# Patient Record
Sex: Male | Born: 1955
Health system: Southern US, Community
[De-identification: ages and names within clinical notes are randomized; demographics above are authoritative.]

## PROBLEM LIST (undated history)

## (undated) DIAGNOSIS — S82891A Other fracture of right lower leg, initial encounter for closed fracture: Secondary | ICD-10-CM

---

## 2007-05-02 ENCOUNTER — Ambulatory Visit: Payer: Self-pay | Admitting: Podiatry

## 2013-07-05 ENCOUNTER — Emergency Department: Payer: Self-pay | Admitting: Emergency Medicine

## 2013-07-06 LAB — CBC
HGB: 14.5 g/dL (ref 13.0–18.0)
MCH: 29.8 pg (ref 26.0–34.0)
MCHC: 34.6 g/dL (ref 32.0–36.0)
MCV: 86 fL (ref 80–100)
Platelet: 224 10*3/uL (ref 150–440)
RBC: 4.88 10*6/uL (ref 4.40–5.90)
WBC: 7.9 10*3/uL (ref 3.8–10.6)

## 2013-07-06 LAB — URINALYSIS, COMPLETE
Bacteria: NONE SEEN
Bilirubin,UR: NEGATIVE
Blood: NEGATIVE
Glucose,UR: NEGATIVE mg/dL (ref 0–75)
Leukocyte Esterase: NEGATIVE
Protein: NEGATIVE
RBC,UR: 1 /HPF (ref 0–5)
Squamous Epithelial: NONE SEEN

## 2013-07-06 LAB — COMPREHENSIVE METABOLIC PANEL
Anion Gap: 3 — ABNORMAL LOW (ref 7–16)
BUN: 12 mg/dL (ref 7–18)
Bilirubin,Total: 0.8 mg/dL (ref 0.2–1.0)
Chloride: 105 mmol/L (ref 98–107)
Co2: 26 mmol/L (ref 21–32)
EGFR (Non-African Amer.): >60
Glucose: 106 mg/dL — ABNORMAL HIGH (ref 65–99)
Osmolality: 268 (ref 275–301)
Potassium: 3.5 mmol/L (ref 3.5–5.1)

## 2015-10-08 ENCOUNTER — Encounter: Payer: Self-pay | Admitting: Emergency Medicine

## 2015-10-08 ENCOUNTER — Emergency Department
Admission: EM | Admit: 2015-10-08 | Discharge: 2015-10-08 | Disposition: A | Discharge status: Home / Self Care | Payer: BLUE CROSS/BLUE SHIELD | Attending: Emergency Medicine | Admitting: Emergency Medicine

## 2015-10-08 ENCOUNTER — Emergency Department: Payer: BLUE CROSS/BLUE SHIELD

## 2015-10-08 DIAGNOSIS — M25461 Effusion, right knee: Secondary | ICD-10-CM | POA: Diagnosis not present

## 2015-10-08 DIAGNOSIS — Y9289 Other specified places as the place of occurrence of the external cause: Secondary | ICD-10-CM | POA: Insufficient documentation

## 2015-10-08 DIAGNOSIS — Y9389 Activity, other specified: Secondary | ICD-10-CM | POA: Insufficient documentation

## 2015-10-08 DIAGNOSIS — S8991XA Unspecified injury of right lower leg, initial encounter: Secondary | ICD-10-CM | POA: Diagnosis present

## 2015-10-08 DIAGNOSIS — Y998 Other external cause status: Secondary | ICD-10-CM | POA: Diagnosis not present

## 2015-10-08 DIAGNOSIS — W1839XA Other fall on same level, initial encounter: Secondary | ICD-10-CM | POA: Insufficient documentation

## 2015-10-08 MED ORDER — NAPROXEN 500 MG PO TABS
500.0000 mg | ORAL_TABLET | Freq: Once | ORAL | Status: AC
  Administered 2015-10-08: 500 mg via ORAL
  Filled 2015-10-08: qty 1

## 2015-10-08 MED ORDER — TRAMADOL HCL 50 MG PO TABS: 50.0000 mg | ORAL_TABLET | Freq: Four times a day (QID) | ORAL | Status: AC | PRN

## 2015-10-08 MED ORDER — TRAMADOL HCL 50 MG PO TABS
50.0000 mg | ORAL_TABLET | Freq: Once | ORAL | Status: DC
  Filled 2015-10-08: qty 1

## 2015-10-08 MED ORDER — NAPROXEN 500 MG PO TABS: 500.0000 mg | ORAL_TABLET | Freq: Two times a day (BID) | ORAL | Status: AC

## 2015-10-08 NOTE — Discharge Instructions (Signed)
Wear elastic knee support for 2-3 days as needed. Knee Effusion Knee effusion means that you have extra fluid in your knee. This can cause pain. Your knee may be more difficult to bend and move. HOME CARE  Use crutches as told by your doctor.  Wear a knee brace as told by your doctor.  Keep your knee raised (elevated) when you are sitting or lying down.  Take medicines only as told by your doctor.  Do any rehabilitation or strengthening exercises as told by your doctor.  Rest your knee as told by your doctor. You may start doing your normal activities again when your doctor says it is okay.  Keep all follow-up visits as told by your doctor. This is important. GET HELP IF:   You continue to have pain in your knee. GET HELP RIGHT AWAY IF:  You have increased swelling or redness of your knee.  You have severe pain in your knee.  You have a fever.   This information is not intended to replace advice given to you by your health care provider. Make sure you discuss any questions you have with your health care provider.   Document Released: 08/18/2010 Document Revised: 08/06/2014 Document Reviewed: 03/01/2014 Elsevier Interactive Patient Education [employer].

## 2015-10-08 NOTE — ED Provider Notes (Signed)
[facility] Emergency Department Provider Note  ____________________________________________  Time seen: Approximately 8:08 PM  I have reviewed the triage vital signs and the nursing notes.   HISTORY  Chief Complaint Knee Pain    HPI Trevor Petty is a 60 y.o. male patient complaining of left knee pain secondary to a fall this afternoon.Patient state there is some mild swelling and pain along the medial aspect of the knee. Patient states pain increase ambulation. Patient has applied ice to the area since this injury. No other palliative measures for this complaint. Patient rates the pain as 7/10. Patient described a pain as "sharp".   History reviewed. No pertinent past medical history.  There are no active problems to display for this patient.   History reviewed. No pertinent past surgical history.  Current Outpatient Rx  Name  Route  Sig  Dispense  Refill  . naproxen (NAPROSYN) 500 MG tablet   Oral   Take 1 tablet (500 mg total) by mouth 2 (two) times daily with a meal.   20 tablet   0   . traMADol (ULTRAM) 50 MG tablet   Oral   Take 1 tablet (50 mg total) by mouth every 6 (six) hours as needed for moderate pain.   12 tablet   0     Allergies Review of patient's allergies indicates no known allergies.  No family history on file.  Social History Social History  Substance Use Topics  . Smoking status: Never Smoker   . Smokeless tobacco: None  . Alcohol Use: No    Review of Systems Constitutional: No fever/chills Eyes: No visual changes. ENT: No sore throat. Cardiovascular: Denies chest pain. Respiratory: Denies shortness of breath. Gastrointestinal: No abdominal pain.  No nausea, no vomiting.  No diarrhea.  No constipation. Genitourinary: Negative for dysuria. Musculoskeletal: Negative for back pain. Skin: Negative for rash. Neurological: Negative for headaches, focal weakness or numbness.  10-point ROS otherwise  negative.  ____________________________________________   PHYSICAL EXAM:  VITAL SIGNS: ED Triage Vitals  Enc Vitals Group     BP 10/08/15 1825 124/82 mmHg     Pulse Rate 10/08/15 1825 58     Resp 10/08/15 1825 20     Temp 10/08/15 1825 97.6 F (36.4 C)     Temp Source 10/08/15 1825 Oral     SpO2 10/08/15 1825 100 %     Weight 10/08/15 1825 165 lb (74.844 kg)     Height 10/08/15 1825 5\' 9"  (1.753 m)     Head Cir --      Peak Flow --      Pain Score 10/08/15 1827 7     Pain Loc --      Pain Edu? --      Excl. in [place]? --     Constitutional: Alert and oriented. Well appearing and in no acute distress. Eyes: Conjunctivae are normal. PERRL. EOMI. Head: Atraumatic. Nose: No congestion/rhinnorhea. Mouth/Throat: Mucous membranes are moist.  Oropharynx non-erythematous. Neck: No stridor.  No cervical spine tenderness to palpation. Hematological/Lymphatic/Immunilogical: No cervical lymphadenopathy. Cardiovascular: Normal rate, regular rhythm. Grossly normal heart sounds.  Good peripheral circulation. Respiratory: Normal respiratory effort.  No retractions. Lungs CTAB. Gastrointestinal: Soft and nontender. No distention. No abdominal bruits. No CVA tenderness. Musculoskeletal: No lower extremity tenderness nor edema.  No joint effusions. Neurologic:  Normal speech and language. No gross focal neurologic deficits are appreciated. No gait instability. Skin:  Skin is warm, dry and intact. No rash noted. Psychiatric: Mood  and affect are normal. Speech and behavior are normal.  ____________________________________________   LABS (all labs ordered are listed, but only abnormal results are displayed)  Labs Reviewed - No data to display ____________________________________________  EKG   ____________________________________________  RADIOLOGY  Mild effusion noticed on the left knee x-ray. I, Sable Feil, personally viewed and evaluated these images (plain radiographs) as part  of my medical decision making, as well as reviewing the written report by the radiologist.  ____________________________________________   PROCEDURES  Procedure(s) performed: None  Critical Care performed: No  ____________________________________________   INITIAL IMPRESSION / ASSESSMENT AND PLAN / ED COURSE  Pertinent labs & imaging results that were available during my care of the patient were reviewed by me and considered in my medical decision making (see chart for details).  Knee effusion secondary to contusion. She given discharge Instructions. Patient given a prescription for naproxen and tramadol. Patient advised to follow orthopedics it is no improvement 3-5 days. ____________________________________________   FINAL CLINICAL IMPRESSION(S) / ED DIAGNOSES  Final diagnoses:  Knee joint effusion, right      Sable Feil, PA-C 10/08/15 2025  Earleen Newport, MD 10/08/15 [id number]

## 2015-10-08 NOTE — ED Notes (Signed)
Patient transported to X-ray 

## 2015-10-08 NOTE — ED Notes (Signed)
Fell this afternoon, landing on L knee. Painful since

## 2017-08-31 IMAGING — CR DG KNEE COMPLETE 4+V*L*
1 series · 4 of 4 positions shown · non-contrast
Comparison: None.

CLINICAL DATA: Left knee pain after fall

EXAM:
LEFT KNEE - COMPLETE 4+ VIEW

[Series 1: dg knee complete 4 views left · 0.14mm/px · 4 of 4 slices shown]
[im 1/4]
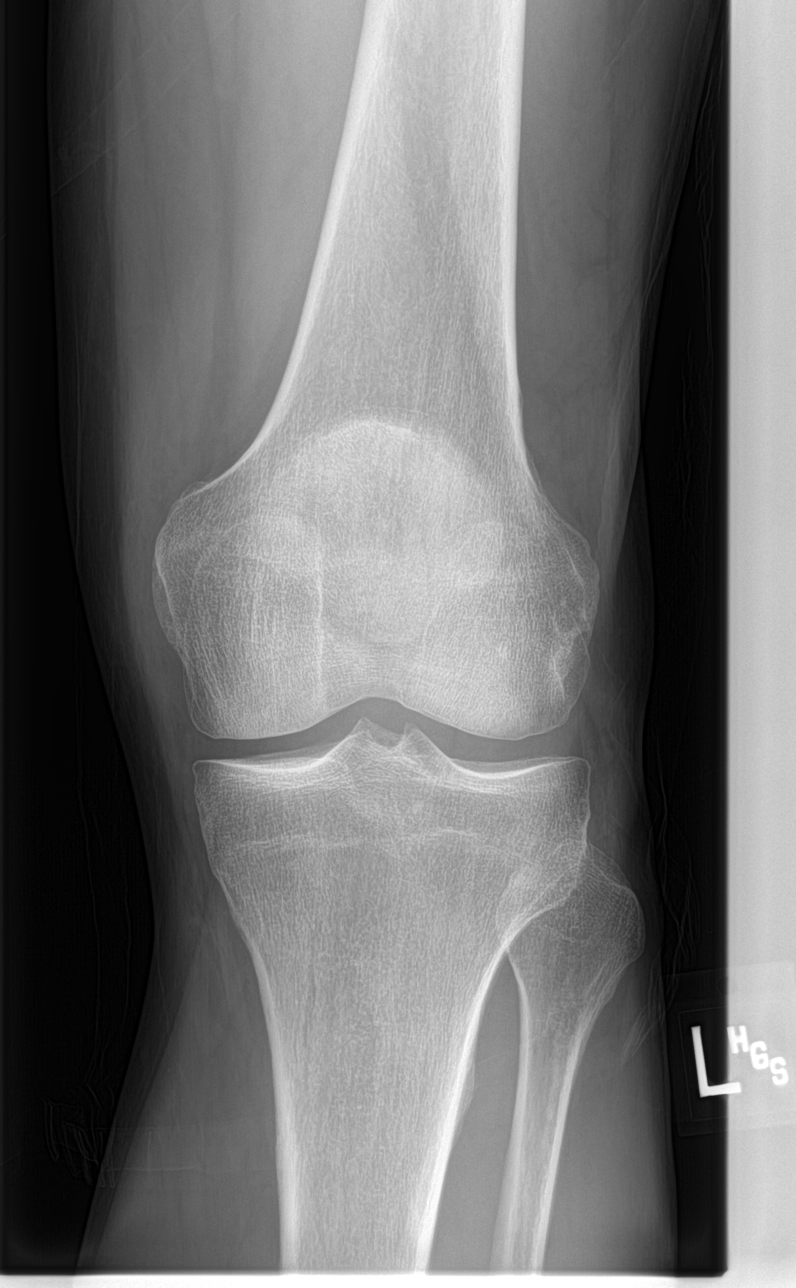
[im 2/4]
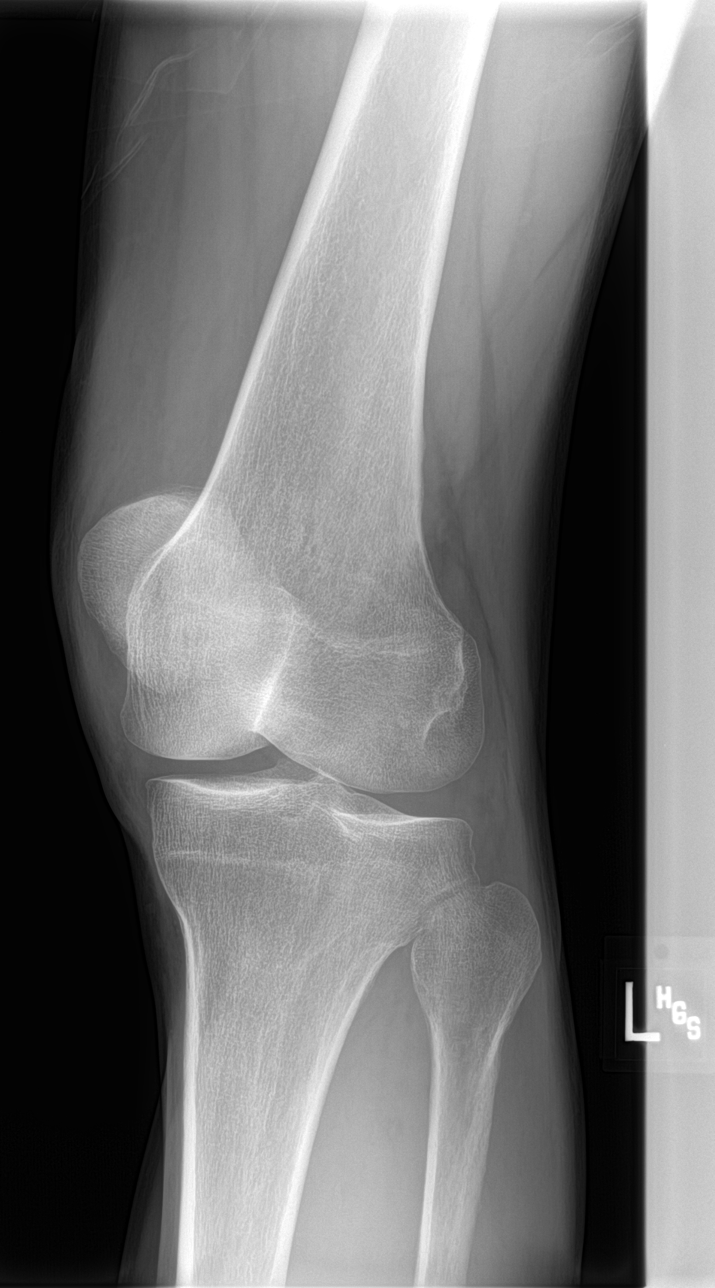
[im 3/4]
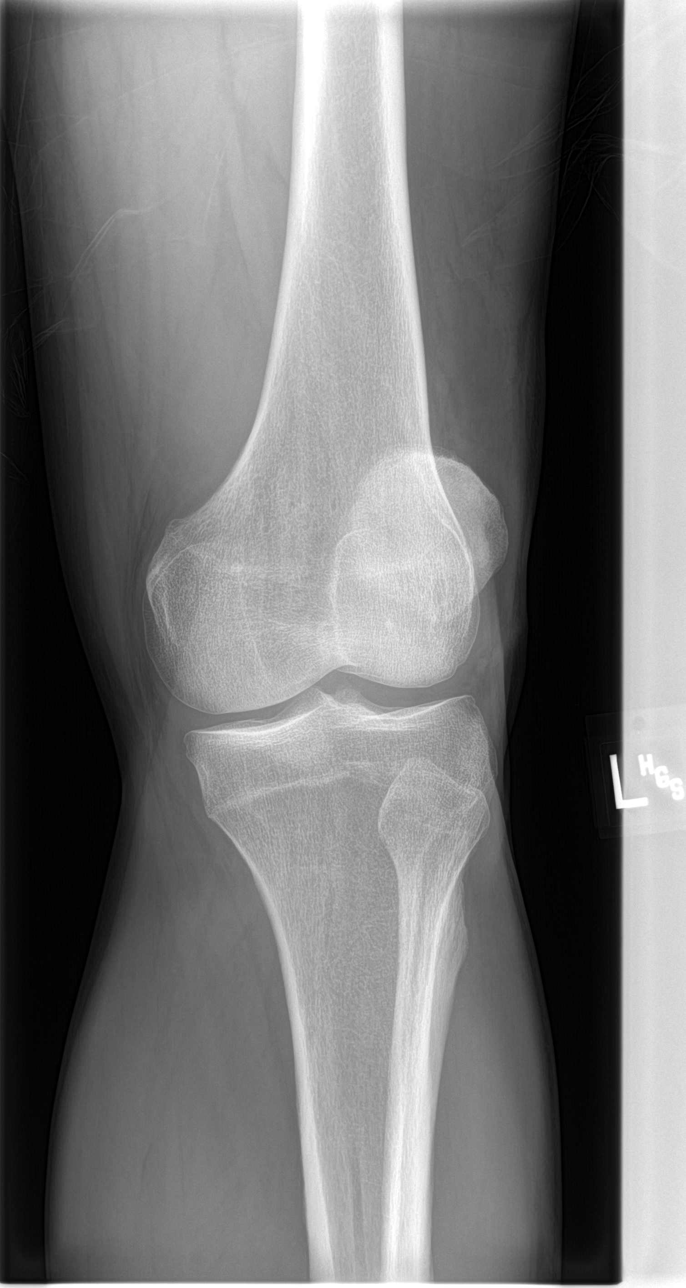
[im 4/4]
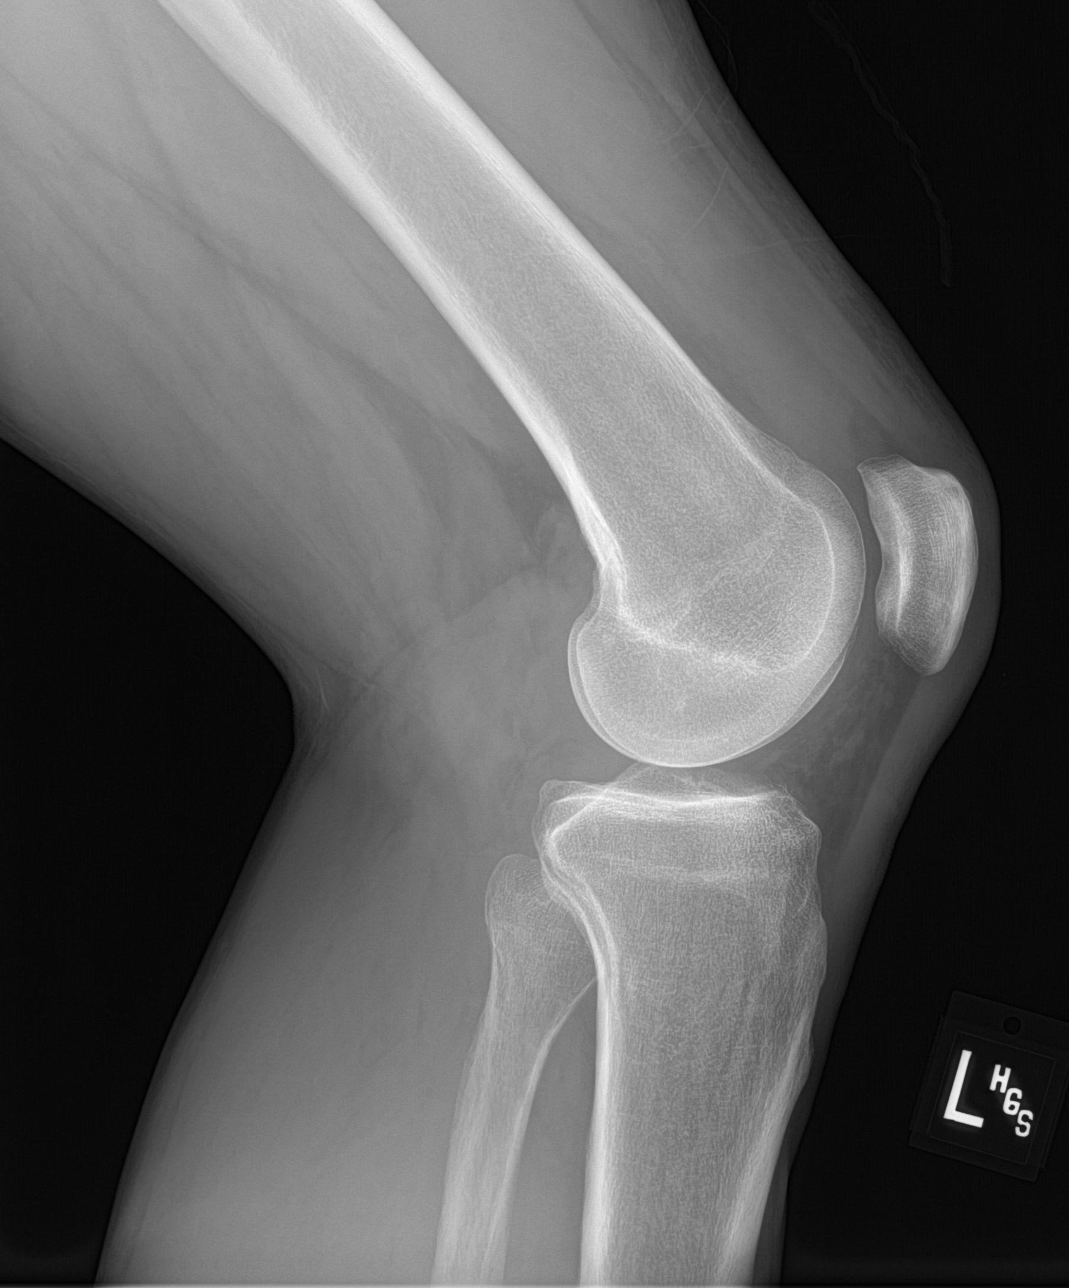

[4 of 4 positions shown; findings below may reference images not displayed]

FINDINGS: There is a small suprapatellar left knee joint effusion. No
fracture, malalignment, arthropathy or suspicious focal osseous
lesion.
IMPRESSION: Small suprapatellar left knee joint effusion, with no fracture or
malalignment.

## 2017-12-09 DIAGNOSIS — J0100 Acute maxillary sinusitis, unspecified: Secondary | ICD-10-CM | POA: Diagnosis not present

## 2017-12-09 DIAGNOSIS — J309 Allergic rhinitis, unspecified: Secondary | ICD-10-CM | POA: Diagnosis not present

## 2018-01-12 DIAGNOSIS — J0100 Acute maxillary sinusitis, unspecified: Secondary | ICD-10-CM | POA: Diagnosis not present

## 2018-07-01 DIAGNOSIS — K625 Hemorrhage of anus and rectum: Secondary | ICD-10-CM | POA: Diagnosis not present

## 2018-08-14 DIAGNOSIS — Z8719 Personal history of other diseases of the digestive system: Secondary | ICD-10-CM | POA: Diagnosis not present

## 2018-08-14 DIAGNOSIS — Z1211 Encounter for screening for malignant neoplasm of colon: Secondary | ICD-10-CM | POA: Diagnosis not present

## 2018-10-10 ENCOUNTER — Encounter: Payer: Self-pay | Admitting: *Deleted

## 2018-10-13 ENCOUNTER — Ambulatory Visit
Admission: RE | Admit: 2018-10-13 | Discharge: 2018-10-13 | Disposition: A | Discharge status: Home / Self Care | Payer: 59 | Attending: Unknown Physician Specialty | Admitting: Unknown Physician Specialty

## 2018-10-13 ENCOUNTER — Encounter: Payer: Self-pay | Admitting: *Deleted

## 2018-10-13 ENCOUNTER — Encounter
Admission: RE | Disposition: A | Discharge status: Home / Self Care | Payer: Self-pay | Source: Home / Self Care | Attending: Unknown Physician Specialty

## 2018-10-13 ENCOUNTER — Ambulatory Visit: Payer: 59 | Admitting: Anesthesiology

## 2018-10-13 DIAGNOSIS — D124 Benign neoplasm of descending colon: Secondary | ICD-10-CM | POA: Insufficient documentation

## 2018-10-13 DIAGNOSIS — Z1211 Encounter for screening for malignant neoplasm of colon: Secondary | ICD-10-CM | POA: Diagnosis not present

## 2018-10-13 HISTORY — DX: Other fracture of right lower leg, initial encounter for closed fracture: S82.891A

## 2018-10-13 HISTORY — PX: COLONOSCOPY WITH PROPOFOL: SHX5780

## 2018-10-13 SURGERY — COLONOSCOPY WITH PROPOFOL
Anesthesia: General

## 2018-10-13 MED ORDER — PROPOFOL 500 MG/50ML IV EMUL
INTRAVENOUS | Status: AC
  Filled 2018-10-13: qty 50

## 2018-10-13 MED ORDER — LIDOCAINE HCL (PF) 2 % IJ SOLN
INTRAMUSCULAR | Status: AC
  Filled 2018-10-13: qty 10

## 2018-10-13 MED ORDER — SODIUM CHLORIDE 0.9 % IV SOLN
INTRAVENOUS | Status: DC
  Administered 2018-10-13: 13:00:00 via INTRAVENOUS

## 2018-10-13 NOTE — Anesthesia Preprocedure Evaluation (Signed)
Anesthesia Evaluation  Patient identified by MRN, date of birth, ID band Patient awake    Reviewed: Allergy & Precautions, H&P , NPO status , Patient's Chart, lab work & pertinent test results  Airway Mallampati: III  TM Distance: >3 FB Neck ROM: full    Dental  (+) Teeth Intact   Pulmonary neg pulmonary ROS, neg COPD, neg recent URI,           Cardiovascular (-) angina(-) Past MI negative cardio ROS  (-) dysrhythmias      Neuro/Psych negative neurological ROS  negative psych ROS   GI/Hepatic negative GI ROS, Neg liver ROS,   Endo/Other  negative endocrine ROS  Renal/GU negative Renal ROS  negative genitourinary   Musculoskeletal   Abdominal   Peds  Hematology negative hematology ROS (+)   Anesthesia Other Findings Past Medical History: No date: Ankle fracture, right  History reviewed. No pertinent surgical history.  BMI    Body Mass Index:  25.25 kg/m      Reproductive/Obstetrics negative OB ROS                             Anesthesia Physical Anesthesia Plan  ASA: I  Anesthesia Plan: General   Post-op Pain Management:    Induction:   PONV Risk Score and Plan: Propofol infusion and TIVA  Airway Management Planned:   Additional Equipment:   Intra-op Plan:   Post-operative Plan:   Informed Consent: I have reviewed the patients History and Physical, chart, labs and discussed the procedure including the risks, benefits and alternatives for the proposed anesthesia with the patient or authorized representative who has indicated his/her understanding and acceptance.     Dental Advisory Given  Plan Discussed with: Anesthesiologist and CRNA  Anesthesia Plan Comments: (Pt prefers colonoscopy without any sedation.  He tolerated this last time with no sedation.  We discussed anesthesia and he gave his consent if it is needed.)        Anesthesia Quick Evaluation

## 2018-10-13 NOTE — Op Note (Signed)
[facility] Patient Name: Trevor Petty Procedure Date: 10/13/2018 1:40 PM MRN: [id number] Account #: [id number] Date of Birth: 18-Jan-1956 Admit Type: Outpatient Age: 63 Room: [facility] ENDO ROOM 1 Gender: Male Note Status: Finalized Procedure:            Colonoscopy Indications:          Screening for colorectal malignant neoplasm Providers:            Manya Silvas, MD Medicines:            Propofol per Anesthesia Complications:        No immediate complications. Procedure:            Pre-Anesthesia Assessment:                       - After reviewing the risks and benefits, the patient                        was deemed in satisfactory condition to undergo the                        procedure.                       After obtaining informed consent, the colonoscope was                        passed under direct vision. Throughout the procedure,                        the patient's blood pressure, pulse, and oxygen                        saturations were monitored continuously. The                        Colonoscope was introduced through the anus and                        advanced to the the cecum, identified by appendiceal                        orifice and ileocecal valve. The colonoscopy was                        somewhat difficult due to a tortuous colon. Successful                        completion of the procedure was aided by applying                        abdominal pressure. The patient tolerated the procedure                        well. The quality of the bowel preparation was good. Findings:      A diminutive polyp was found in the sigmoid colon. The polyp was       sessile. The polyp was removed with two bites of a jumbo cold forceps.       Resection and retrieval were complete.      The exam was otherwise without abnormality. Impression:           -  One diminutive polyp in the sigmoid colon, removed                        with a  jumbo cold forceps. Resected and retrieved.                       - The examination was otherwise normal. Recommendation:       - Await pathology results. Manya Silvas, MD 10/13/2018 2:34:56 PM This report has been signed electronically. Number of Addenda: 0 Note Initiated On: 10/13/2018 1:40 PM Scope Withdrawal Time: 0 hours 10 minutes 11 seconds  Total Procedure Duration: 0 hours 26 minutes 27 seconds       [facility]

## 2018-10-15 LAB — SURGICAL PATHOLOGY
